# Patient Record
Sex: Male | Born: 1960
Health system: Southern US, Community
[De-identification: ages and names within clinical notes are randomized; demographics above are authoritative.]

## PROBLEM LIST (undated history)

## (undated) DIAGNOSIS — E7800 Pure hypercholesterolemia, unspecified: Secondary | ICD-10-CM

## (undated) DIAGNOSIS — N200 Calculus of kidney: Secondary | ICD-10-CM

## (undated) DIAGNOSIS — I10 Essential (primary) hypertension: Secondary | ICD-10-CM

## (undated) HISTORY — PX: LITHOTRIPSY: SUR834

---

## 2016-12-18 ENCOUNTER — Ambulatory Visit
Admission: RE | Admit: 2016-12-18 | Discharge: 2016-12-18 | Disposition: A | Discharge status: Home / Self Care | Payer: BLUE CROSS/BLUE SHIELD | Source: Ambulatory Visit | Attending: Cardiology | Admitting: Cardiology

## 2016-12-18 ENCOUNTER — Other Ambulatory Visit: Payer: Self-pay | Admitting: Cardiology

## 2016-12-18 DIAGNOSIS — R0602 Shortness of breath: Secondary | ICD-10-CM

## 2017-01-05 ENCOUNTER — Other Ambulatory Visit: Payer: Self-pay | Admitting: Cardiology

## 2017-01-05 DIAGNOSIS — I119 Hypertensive heart disease without heart failure: Secondary | ICD-10-CM

## 2017-01-08 ENCOUNTER — Ambulatory Visit
Admission: RE | Admit: 2017-01-08 | Discharge: 2017-01-08 | Disposition: A | Discharge status: Home / Self Care | Payer: No Typology Code available for payment source | Source: Ambulatory Visit | Attending: Cardiology | Admitting: Cardiology

## 2017-01-08 DIAGNOSIS — I119 Hypertensive heart disease without heart failure: Secondary | ICD-10-CM

## 2017-05-07 ENCOUNTER — Encounter (HOSPITAL_BASED_OUTPATIENT_CLINIC_OR_DEPARTMENT_OTHER): Payer: Self-pay

## 2017-05-07 ENCOUNTER — Emergency Department (HOSPITAL_BASED_OUTPATIENT_CLINIC_OR_DEPARTMENT_OTHER)
Admission: EM | Admit: 2017-05-07 | Discharge: 2017-05-07 | Disposition: A | Discharge status: Home / Self Care | Payer: BLUE CROSS/BLUE SHIELD | Attending: Emergency Medicine | Admitting: Emergency Medicine

## 2017-05-07 ENCOUNTER — Emergency Department (HOSPITAL_BASED_OUTPATIENT_CLINIC_OR_DEPARTMENT_OTHER): Payer: BLUE CROSS/BLUE SHIELD

## 2017-05-07 DIAGNOSIS — N189 Chronic kidney disease, unspecified: Secondary | ICD-10-CM | POA: Diagnosis not present

## 2017-05-07 DIAGNOSIS — N289 Disorder of kidney and ureter, unspecified: Secondary | ICD-10-CM

## 2017-05-07 DIAGNOSIS — Z87442 Personal history of urinary calculi: Secondary | ICD-10-CM | POA: Diagnosis not present

## 2017-05-07 DIAGNOSIS — I129 Hypertensive chronic kidney disease with stage 1 through stage 4 chronic kidney disease, or unspecified chronic kidney disease: Secondary | ICD-10-CM | POA: Diagnosis not present

## 2017-05-07 DIAGNOSIS — Z79899 Other long term (current) drug therapy: Secondary | ICD-10-CM | POA: Insufficient documentation

## 2017-05-07 DIAGNOSIS — R109 Unspecified abdominal pain: Secondary | ICD-10-CM | POA: Diagnosis present

## 2017-05-07 DIAGNOSIS — N201 Calculus of ureter: Secondary | ICD-10-CM | POA: Diagnosis not present

## 2017-05-07 HISTORY — DX: Essential (primary) hypertension: I10

## 2017-05-07 HISTORY — DX: Calculus of kidney: N20.0

## 2017-05-07 HISTORY — DX: Pure hypercholesterolemia, unspecified: E78.00

## 2017-05-07 LAB — BASIC METABOLIC PANEL
Anion gap: 10 (ref 5–15)
BUN: 15 mg/dL (ref 6–20)
CALCIUM: 9 mg/dL (ref 8.9–10.3)
CO2: 23 mmol/L (ref 22–32)
Chloride: 103 mmol/L (ref 101–111)
Creatinine, Ser: 1.31 mg/dL — ABNORMAL HIGH (ref 0.61–1.24)
GFR calc Af Amer: >60 mL/min (ref >60)
GLUCOSE: 136 mg/dL — AB (ref 65–99)
POTASSIUM: 4 mmol/L (ref 3.5–5.1)
Sodium: 136 mmol/L (ref 135–145)

## 2017-05-07 LAB — CBC
HCT: 44.7 % (ref 39.0–52.0)
Hemoglobin: 15.5 g/dL (ref 13.0–17.0)
MCH: 30.5 pg (ref 26.0–34.0)
MCHC: 34.7 g/dL (ref 30.0–36.0)
MCV: 88 fL (ref 78.0–100.0)
PLATELETS: 249 10*3/uL (ref 150–400)
RBC: 5.08 MIL/uL (ref 4.22–5.81)
RDW: 13.4 % (ref 11.5–15.5)
WBC: 9.2 10*3/uL (ref 4.0–10.5)

## 2017-05-07 LAB — URINALYSIS, ROUTINE W REFLEX MICROSCOPIC
BILIRUBIN URINE: NEGATIVE
GLUCOSE, UA: NEGATIVE mg/dL
Ketones, ur: NEGATIVE mg/dL
Leukocytes, UA: NEGATIVE
Nitrite: NEGATIVE
PH: 6 (ref 5.0–8.0)
Protein, ur: NEGATIVE mg/dL
SPECIFIC GRAVITY, URINE: 1.021 (ref 1.005–1.030)

## 2017-05-07 LAB — URINALYSIS, MICROSCOPIC (REFLEX)

## 2017-05-07 MED ORDER — ONDANSETRON 4 MG PO TBDP: 4.0000 mg | ORAL_TABLET | Freq: Three times a day (TID) | ORAL | 0 refills | Status: DC | PRN

## 2017-05-07 MED ORDER — OXYCODONE-ACETAMINOPHEN 5-325 MG PO TABS: 1.0000 | ORAL_TABLET | Freq: Four times a day (QID) | ORAL | 0 refills | Status: DC | PRN

## 2017-05-07 MED ORDER — TAMSULOSIN HCL 0.4 MG PO CAPS: 0.4000 mg | ORAL_CAPSULE | Freq: Every day | ORAL | 0 refills | Status: DC

## 2017-05-07 MED ORDER — HYDROMORPHONE HCL 1 MG/ML IJ SOLN
1.0000 mg | Freq: Once | INTRAMUSCULAR | Status: AC
  Administered 2017-05-07: 1 mg via INTRAVENOUS
  Filled 2017-05-07: qty 1

## 2017-05-07 MED ORDER — ONDANSETRON HCL 4 MG/2ML IJ SOLN
4.0000 mg | Freq: Once | INTRAMUSCULAR | Status: AC
  Administered 2017-05-07: 4 mg via INTRAVENOUS
  Filled 2017-05-07: qty 2

## 2017-05-07 MED FILL — OXYCODONE-ACETAMINOPHEN 5-3: 5-325 | 2 days supply | Qty: 15 | Fill #0

## 2017-05-07 MED FILL — TAMSULOSIN HCL 0.4 MG CAP: 0.4 | 30 days supply | Qty: 30 | Fill #0

## 2017-05-07 MED FILL — ONDANSETRON ODT 4 MG TABLET: 4 | 6 days supply | Qty: 20 | Fill #0

## 2017-05-07 NOTE — Discharge Instructions (Signed)
Return to the ED with any concerns including fever/chills, vomiting and not able to keep down liquids, pain that is not controlled by medications, decreased level of alertness/lethargy, or any other alarming symptoms  Your creatinine was mildly elevated- a measure of kidney function- you should be sure to increase your fluid intake and have the creatinine rechecked in one week

## 2017-05-07 NOTE — ED Triage Notes (Signed)
C/o right flank pain started last night-states feels like kidney stone-NAD-steady gait

## 2017-05-07 NOTE — ED Notes (Signed)
Took ibuprofen and Tramadol at 0930; Zofran 4mg  at 1130.

## 2017-05-07 NOTE — ED Notes (Signed)
Patient transported to CT 

## 2017-05-07 NOTE — ED Provider Notes (Signed)
[facility]EM[facility]ENCY DEPT Provider Note   CSN: [id number] Arrival date & time: 05/07/17  1144     History   Chief Complaint Chief Complaint  Patient presents with  . Flank Pain    HPI Melvin Escobar is a 56 y.o. male.  HPI  Pt presenting with c/o right sided flank pain.  Pain began approx midnight last night.  He took a tramadol and was able to sleep.  However today when trying to go to work the pain has been getting worse to the point of being unbearable.  Pain comes and goes.  Associated with some nausea, no vomiting.  No fever/chills.  Denies dysuria.  Has had hx of kidney stone- but last was more than 20 years ago.  There are no other associated systemic symptoms, there are no other alleviating or modifying factors.   Past Medical History:  Diagnosis Date  . High cholesterol   . Hypertension   . Kidney stone     There are no active problems to display for this patient.   Past Surgical History:  Procedure Laterality Date  . LITHOTRIPSY         Home Medications    Prior to Admission medications   Medication Sig Start Date End Date Taking? Authorizing Provider  Ezetimibe-Simvastatin (VYTORIN PO) Take by mouth.   Yes [provider]  FENOFIBRATE PO Take by mouth.   Yes [provider]  Ondansetron HCl (ZOFRAN PO) Take by mouth.   Yes [provider]  RAMIPRIL PO Take by mouth.   Yes [provider]  TRAMADOL HCL PO Take by mouth.   Yes [provider]  ondansetron (ZOFRAN ODT) 4 MG disintegrating tablet Take 1 tablet (4 mg total) by mouth every 8 (eight) hours as needed for nausea or vomiting. 05/07/17   Mabe, Latanya Maudlin, MD  oxyCODONE-acetaminophen (PERCOCET/ROXICET) 5-325 MG tablet Take 1-2 tablets by mouth every 6 (six) hours as needed for severe pain. 05/07/17   Mabe, Latanya Maudlin, MD  tamsulosin (FLOMAX) 0.4 MG CAPS capsule Take 1 capsule (0.4 mg total) by mouth daily. 05/07/17   Mabe, Latanya Maudlin, MD    Family History No family  history on file.  Social History Social History  Substance Use Topics  . Smoking status: Never Smoker  . Smokeless tobacco: Never Used  . Alcohol use No     Allergies   Patient has no known allergies.   Review of Systems Review of Systems  ROS reviewed and all otherwise negative except for mentioned in HPI   Physical Exam Updated Vital Signs BP 132/86 (BP Location: Right Arm)   Pulse 74   Temp 97.7 F (36.5 C) (Oral)   Resp 18   Ht 5\' 8"  (1.727 m)   Wt 105.2 kg (232 lb)   SpO2 95%   BMI 35.28 kg/m  Vitals reviewed Physical Exam Physical Examination: General appearance - alert, well appearing, and in no distress Mental status - alert, oriented to person, place, and time Eyes - no conjunctival injection, no scleral icterus Mouth - mucous membranes moist, pharynx normal without lesions Chest - clear to auscultation, no wheezes, rales or rhonchi, symmetric air entry Heart - normal rate, regular rhythm, normal S1, S2, no murmurs, rubs, clicks or gallops Abdomen - soft, nontender, nondistended, no masses or organomegaly Back exam - no midline tenderness to palpation, mild right CVA tenderness Neurological - alert, oriented, normal speech Extremities - peripheral pulses normal, no pedal edema, no clubbing or cyanosis Skin -  normal coloration and turgor, no rashes  ED Treatments / Results  Labs (all labs ordered are listed, but only abnormal results are displayed) Labs Reviewed  URINALYSIS, ROUTINE W REFLEX MICROSCOPIC - Abnormal; Notable for the following:       Result Value   Hgb urine dipstick MODERATE (*)    All other components within normal limits  BASIC METABOLIC PANEL - Abnormal; Notable for the following:    Glucose, Bld 136 (*)    Creatinine, Ser 1.31 (*)    All other components within normal limits  URINALYSIS, MICROSCOPIC (REFLEX) - Abnormal; Notable for the following:    Bacteria, UA MANY (*)    Squamous Epithelial / LPF 0-5 (*)    All other  components within normal limits  CBC    EKG  EKG Interpretation None       Radiology Ct Renal Stone Study  Result Date: 05/07/2017 CLINICAL DATA:  Right flank pain. EXAM: CT ABDOMEN AND PELVIS WITHOUT CONTRAST TECHNIQUE: Multidetector CT imaging of the abdomen and pelvis was performed following the standard protocol without IV contrast. COMPARISON:  None. FINDINGS: Lower chest: Unremarkable. Hepatobiliary: Mild diffuse low density of the liver relative to the spleen. 3 mm gallstone in the gallbladder. No gallbladder wall thickening or pericholecystic fluid. Pancreas: Unremarkable. No pancreatic ductal dilatation or surrounding inflammatory changes. Spleen: Normal in size without focal abnormality. Adrenals/Urinary Tract: Normal appearing adrenal glands, left kidney, left ureter and urinary bladder. Moderate dilatation of the right renal collecting system to the level of a 2 mm calculus at the ureteropelvic junction. No ureteral calculi more distally. Stomach/Bowel: Stomach is within normal limits. Appendix appears normal. No evidence of bowel wall thickening, distention, or inflammatory changes. Vascular/Lymphatic: Minimal atheromatous arterial calcification without aneurysm. No enlarged lymph nodes. Reproductive: Mildly enlarged prostate gland containing coarse calcifications. Other: Bilateral inguinal hernias containing fat. Tiny umbilical hernia containing fat. Musculoskeletal: L3 and L4 vertebral body hemangiomas. Similar-appearing area in the left iliac bone, possibly representing an hemangioma. Mild lumbar and lower thoracic spine degenerative changes. IMPRESSION: 1. 2 mm right UPJ calculus causing moderate right hydronephrosis. 2. Cholelithiasis without evidence cholecystitis. 3. Mild diffuse hepatic steatosis. Electronically Signed   By: Beckie SaltsSteven  Reid M.D.   On: 05/07/2017 12:59    Procedures Procedures (including critical care time)  Medications Ordered in ED Medications  HYDROmorphone  (DILAUDID) injection 1 mg (1 mg Intravenous Given 05/07/17 1240)  ondansetron (ZOFRAN) injection 4 mg (4 mg Intravenous Given 05/07/17 1240)     Initial Impression / Assessment and Plan / ED Course  I have reviewed the triage vital signs and the nursing notes.  Pertinent labs & imaging results that were available during my care of the patient were reviewed by me and considered in my medical decision making (see chart for details).     Pt presenting with c/o right sided flank pain.  Has had renal stone but not in the past 20 years.  Abdominal exam is benign.  CT scan shows 2mm obstructive stone.  Urine is reassuring.  Creat is 1.3- no baseline to compare.  Pt is drinking liquids well- started on flomax, percocet, zofran for symptom control.  Advised to call for f/u with urology.  Advised to have creatinine rechecked within 1 week. Pain controlled at time of discharge.   Discharged with strict return precautions.  Pt agreeable with plan.  Final Clinical Impressions(s) / ED Diagnoses   Final diagnoses:  Right ureteral stone  Renal insufficiency    New Prescriptions Discharge  Medication List as of 05/07/2017  2:10 PM    START taking these medications   Details  ondansetron (ZOFRAN ODT) 4 MG disintegrating tablet Take 1 tablet (4 mg total) by mouth every 8 (eight) hours as needed for nausea or vomiting., Starting Fri 05/07/2017, Print    oxyCODONE-acetaminophen (PERCOCET/ROXICET) 5-325 MG tablet Take 1-2 tablets by mouth every 6 (six) hours as needed for severe pain., Starting Fri 05/07/2017, Print    tamsulosin (FLOMAX) 0.4 MG CAPS capsule Take 1 capsule (0.4 mg total) by mouth daily., Starting Fri 05/07/2017, Print         Mabe, Latanya MaudlinMartha L, MD 05/08/17 [phone]

## 2017-06-21 ENCOUNTER — Other Ambulatory Visit: Payer: Self-pay | Admitting: Family Medicine

## 2017-06-21 DIAGNOSIS — R7989 Other specified abnormal findings of blood chemistry: Secondary | ICD-10-CM

## 2017-06-21 DIAGNOSIS — R945 Abnormal results of liver function studies: Principal | ICD-10-CM

## 2017-06-24 ENCOUNTER — Ambulatory Visit
Admission: RE | Admit: 2017-06-24 | Discharge: 2017-06-24 | Disposition: A | Discharge status: Home / Self Care | Payer: BLUE CROSS/BLUE SHIELD | Source: Ambulatory Visit | Attending: Family Medicine | Admitting: Family Medicine

## 2017-06-24 DIAGNOSIS — R7989 Other specified abnormal findings of blood chemistry: Secondary | ICD-10-CM

## 2017-06-24 DIAGNOSIS — R945 Abnormal results of liver function studies: Principal | ICD-10-CM

## 2017-09-29 IMAGING — CT CT HEART SCORING
1 of 2 series · 11 of 20 positions shown, 14 images · non-contrast
Comparison: Chest radiograph 12/18/2016

CLINICAL DATA: Intermittent chest pain. Family history of heart
disease. Hypertensive cardiomegaly.

EXAM:
CT HEART FOR CALCIUM SCORING
TECHNIQUE: CT heart was performed on a 64 channel system using prospective ECG
gating.
A non-contrast exam for calcium scoring was performed.
Note that this exam targets the heart and the chest was not imaged
in its entirety.

[Series 2: smartscore - gated 0.4 sec · axial · 0.50mm/px · z∈[-277,-147]mm · 11 of 64 slices shown, 14 images]
[im 6/64  vessel]
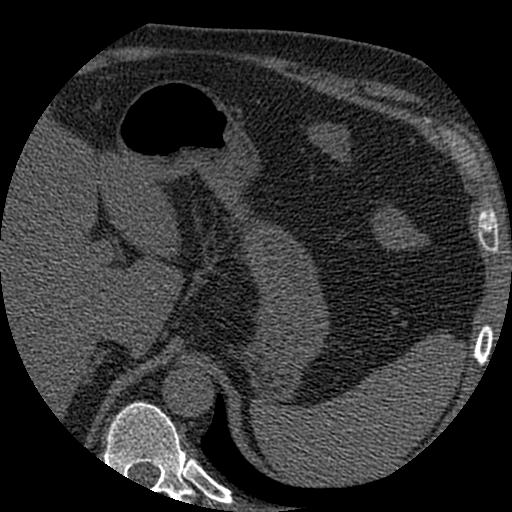
[im 6/64  lung]
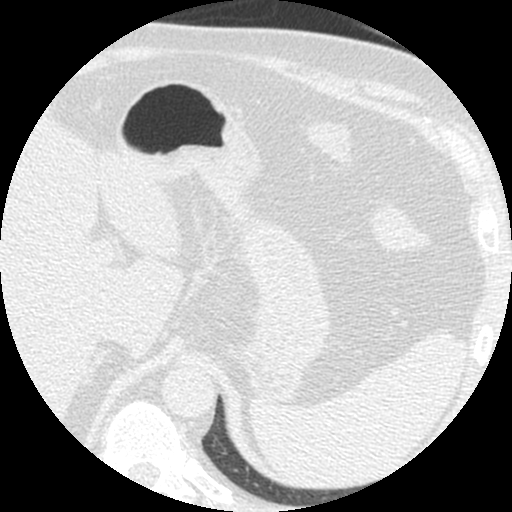
[im 11/64  vessel]
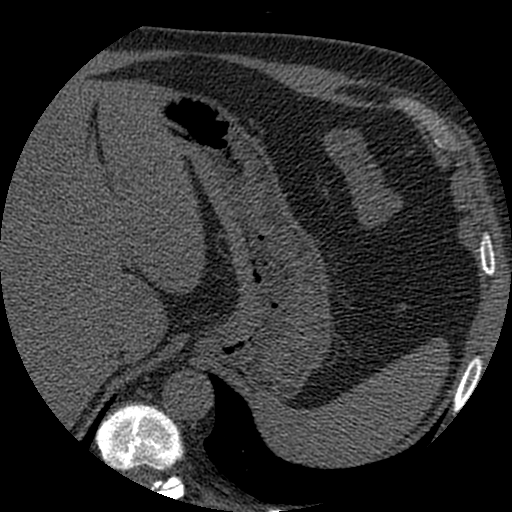
[im 16/64  vessel]
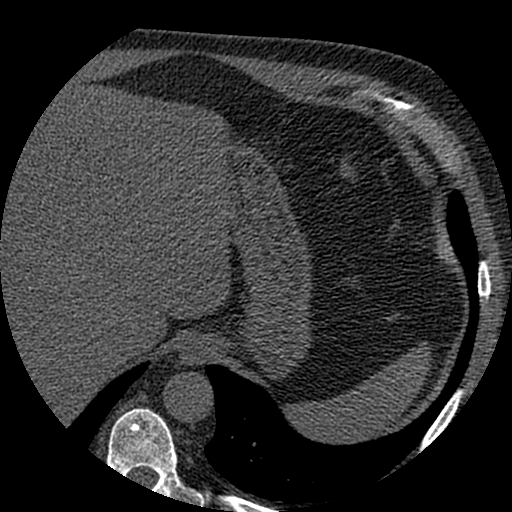
[im 22/64  vessel]
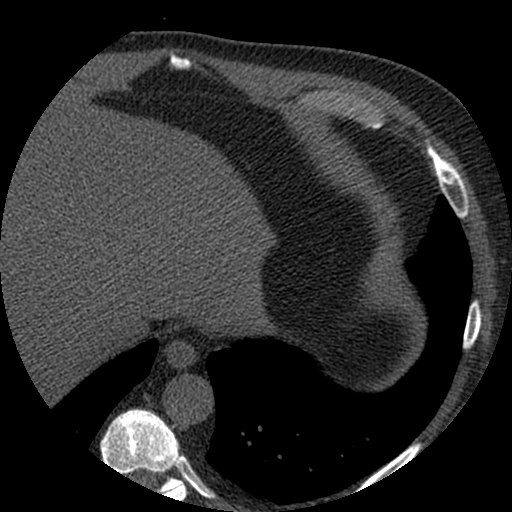
[im 27/64  vessel]
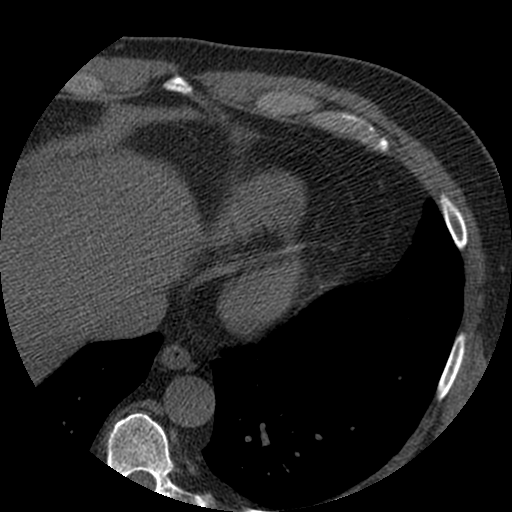
[im 27/64  lung]
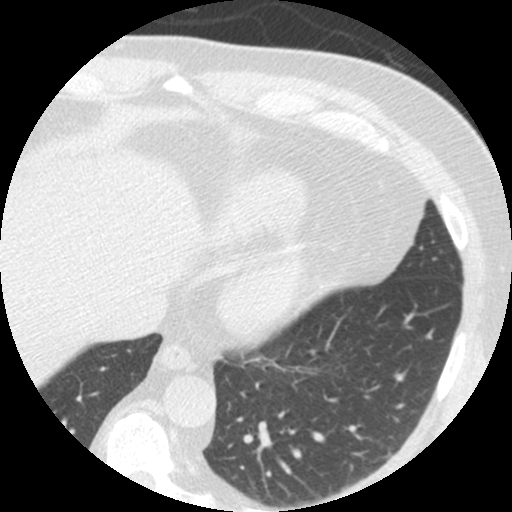
[im 32/64  vessel]
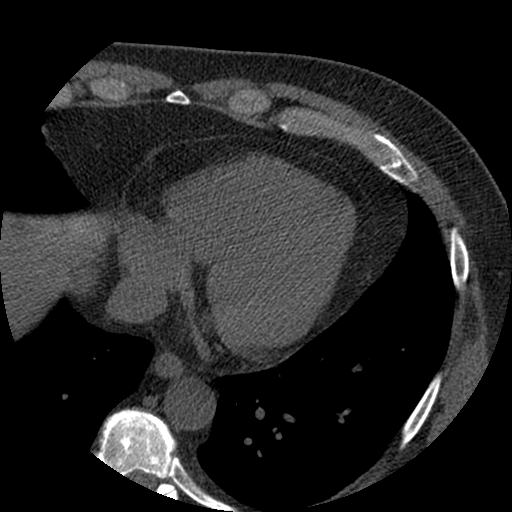
[im 37/64  vessel]
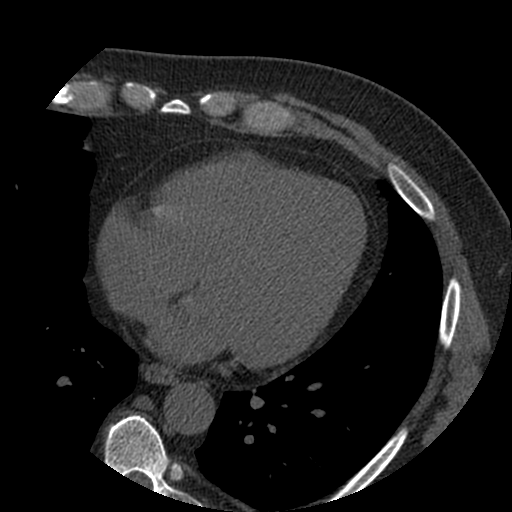
[im 43/64  vessel]
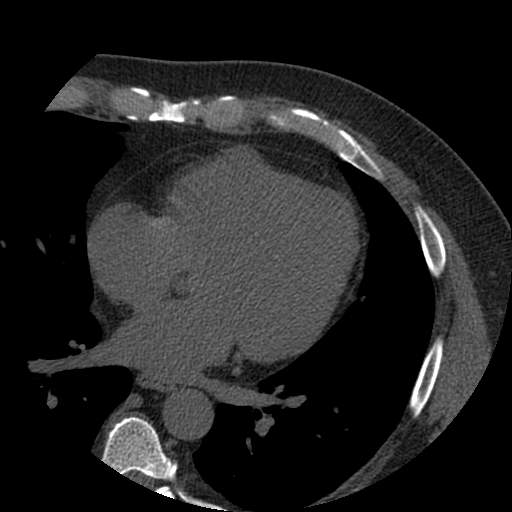
[im 48/64  vessel]
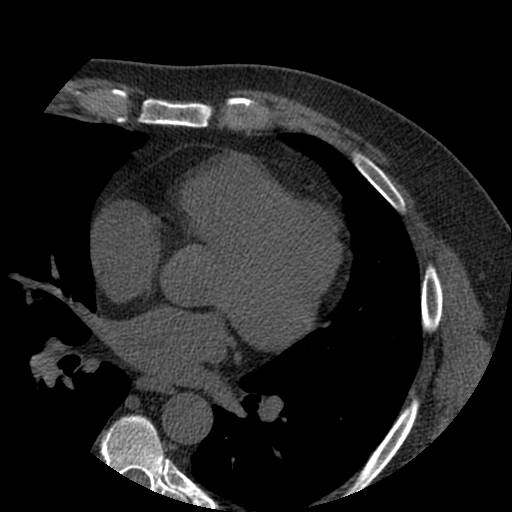
[im 48/64  lung]
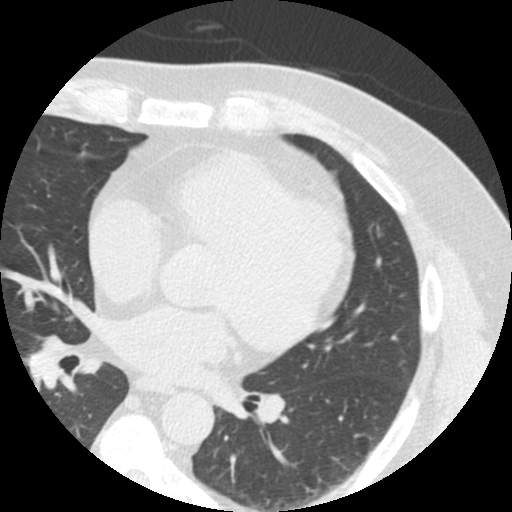
[im 53/64  vessel]
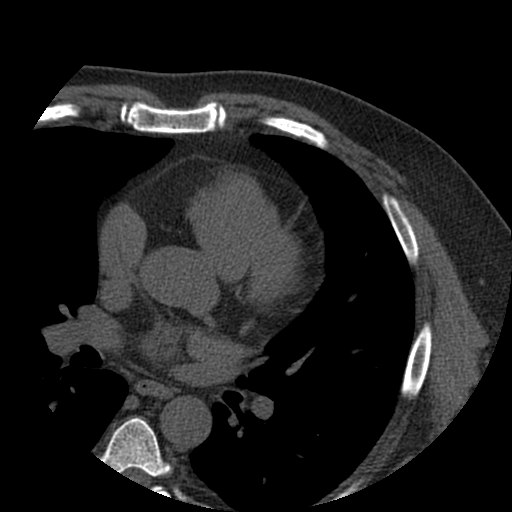
[im 58/64  vessel]
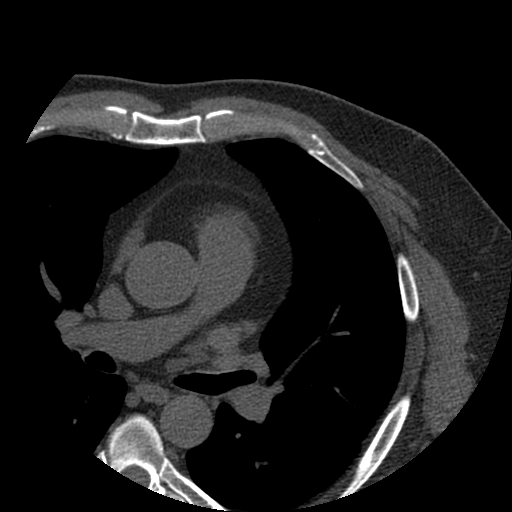

[11 of 20 positions shown; findings below may reference images not displayed]

FINDINGS: Technical quality: Adequate

CORONARY CALCIUM

Total Agatston Score:

[HOSPITAL] percentile:  52

OTHER FINDINGS:

Cardiovascular: Heart size is normal. No significant pericardial
fluid. Normal caliber of the visualized thoracic aorta. Trace
calcifications near the aortic root. Normal caliber of the
visualized pulmonary arteries. Small focus of calcification
involving the distal left circumflex artery near the PDA. No other
definite coronary artery calcifications.

Mediastinum/Nodes: Visualized esophagus is unremarkable. No
significant lymph node enlargement.

Lungs/Pleura: No pleural fluid.  Visualized lungs are clear.

Upper Abdomen: Visualized upper abdominal structures are
unremarkable.

Musculoskeletal: No acute bone abnormality.
IMPRESSION: Coronary calcium score is 5.63 and that is 52 percentile for
patients of the same age, gender and ethnicity.

## 2021-05-28 DIAGNOSIS — E7800 Pure hypercholesterolemia, unspecified: Secondary | ICD-10-CM | POA: Insufficient documentation

## 2021-05-28 DIAGNOSIS — N200 Calculus of kidney: Secondary | ICD-10-CM | POA: Insufficient documentation

## 2021-05-28 DIAGNOSIS — I10 Essential (primary) hypertension: Secondary | ICD-10-CM | POA: Insufficient documentation

## 2021-06-04 ENCOUNTER — Encounter: Payer: Self-pay | Admitting: Cardiology

## 2021-06-04 ENCOUNTER — Other Ambulatory Visit: Payer: Self-pay

## 2021-06-04 ENCOUNTER — Ambulatory Visit (INDEPENDENT_AMBULATORY_CARE_PROVIDER_SITE_OTHER): Payer: 59 | Admitting: Cardiology

## 2021-06-04 VITALS — BP 124/70 | HR 85 | Ht 68.0 in | Wt 235.1 lb

## 2021-06-04 DIAGNOSIS — R0602 Shortness of breath: Secondary | ICD-10-CM

## 2021-06-04 DIAGNOSIS — E782 Mixed hyperlipidemia: Secondary | ICD-10-CM | POA: Diagnosis not present

## 2021-06-04 DIAGNOSIS — R931 Abnormal findings on diagnostic imaging of heart and coronary circulation: Secondary | ICD-10-CM

## 2021-06-04 DIAGNOSIS — I10 Essential (primary) hypertension: Secondary | ICD-10-CM | POA: Diagnosis not present

## 2021-06-04 MED ORDER — METOPROLOL TARTRATE 100 MG PO TABS: 100.0000 mg | ORAL_TABLET | Freq: Once | ORAL | 0 refills | Status: DC

## 2021-06-04 NOTE — Progress Notes (Signed)
Cardiology Office Note:    Date:  06/04/2021   ID:  SAFWAN TOMEI, DOB 04/03/1961, MRN [id number]  PCP:  Tally Joe, MD  Cardiologist:  Norman Herrlich, MD   Referring MD: Tally Joe, MD  ASSESSMENT:    1. SOB (shortness of breath) on exertion   2. Primary hypertension   3. Agatston coronary artery calcium score less than 100    PLAN:    In order of problems listed above:  His clinical problem shortness of breath exertional which is beginning to interfere with his life with associated exertional thoracic pain but is since uncommon that he has scapular discomfort.  His traditional risk factors and noteworthy for hypertension dyslipidemia but he also has a family history of premature CAD in her mid range coronary artery calcium score.  For further evaluation has to do a proBNP level if normal I do not think he requires an echocardiogram as it typically separates pulmonary from cardiac sources of shortness of breath and a cardiac CTA to answer if he has obstructive CAD.  He has no dye allergy I reviewed this with the patient he agrees and I do not think functional testing or stress testing would be helpful.  If the studies are unremarkable then formal PFTs and will also get chest CT as part of the CTA protocol Stable continue current treatment ACE inhibitor Lipids are at target continue combined therapy statin Zetia fenofibric  Next appointment 6 weeks   Medication Adjustments/Labs and Tests Ordered: Current medicines are reviewed at length with the patient today.  Concerns regarding medicines are outlined above.  Orders Placed This Encounter  Procedures   CT CORONARY MORPH W/CTA COR W/SCORE W/CA W/CM &/OR WO/CM   Pro b natriuretic peptide (BNP)   Basic metabolic panel   EKG 12-Lead   No orders of the defined types were placed in this encounter.     Chief complaint: Shortness of breath and aching in my left scapular area with activity that is becoming bothersome.  I am  concerned to have underlying heart disease  History of Present Illness:    JACERE PANGBORN is a 60 y.o. male who is being seen today for the evaluation of shortness of breath at the request of Tally Joe, MD.   chart review shows that he has a history of sleep apnea prediabetes hypertension hyperlipidemia obesity hepatic steatosis   recent labs showed A1c 5.8% hemoglobin 16.4 creatinine 1.01 GFR 86 cc potassium 4.7 sodium 141 Cholesterol 185 triglycerides 195 HDL 40 LDL 110  Office note 04/15/2021 with a history of chest discomfort and shortness of breath as well as a normal treadmill stress test when he had seen Dr. Donnie Aho in 2018  He had a coronary CT calcium score performed 2018 with a calcium score 5.6 -52nd percentile for age sex decision with focal calcification of distal left circumflex coronary artery and mild aortic atherosclerosis.  He has developed shortness of breath and is concerned he has underlying heart disease He is incorporating recorder he walks with parking lot and climbs stairs he is breathless he does not need to stop and rest but he gets an achy left scapular area associated with it. He has CPAP and is compliant with his device He notices edema in his ankles the end of the day when he first gets into bed to just CPAP he finds himself breathless but he does not have PND. He has no known history of congenital rheumatic heart disease He is a non-smoker  and has no history of occupational lung disease no cough or wheezing. His cardiovascular risk factors are also noteworthy for father who had myocardial infarction at age less than 24 and multiple strokes and his mother passed late in life.  Past Medical History:  Diagnosis Date   High cholesterol    Hypertension    Kidney stone     Past Surgical History:  Procedure Laterality Date   LITHOTRIPSY      Current Medications: Current Meds  Medication Sig   ezetimibe-simvastatin (VYTORIN) 10-20 MG tablet Take 1  tablet by mouth daily.   FENOFIBRATE PO Take by mouth.   Omega-3 Fatty Acids (FISH OIL) 1000 MG CAPS Take 1 capsule by mouth daily.   RAMIPRIL PO Take 10 mg by mouth daily.   Semaglutide (OZEMPIC, 1 MG/DOSE,  AFB) Inject 1 mg into the skin once a week.     Allergies:   Patient has no known allergies.   Social History   Socioeconomic History   Marital status: Married    Spouse name: Not on file   Number of children: Not on file   Years of education: Not on file   Highest education level: Not on file  Occupational History   Not on file  Tobacco Use   Smoking status: Never   Smokeless tobacco: Never  Substance and Sexual Activity   Alcohol use: No   Drug use: No   Sexual activity: Not on file  Other Topics Concern   Not on file  Social History Narrative   Not on file   Social Determinants of Health   Financial Resource Strain: Not on file  Food Insecurity: Not on file  Transportation Needs: Not on file  Physical Activity: Not on file  Stress: Not on file  Social Connections: Not on file     Family History: The patient's significant for CAD in both parents is detailed on her history  ROS:   ROS Please see the history of present illness.     All other systems reviewed and are negative.  EKGs/Labs/Other Studies Reviewed:    The following studies were reviewed today:   EKG:  EKG is  ordered today.  The ekg ordered today is personally reviewed and demonstrates sinus rhythm and is normal    Physical Exam:    VS:  BP 124/70 (BP Location: Right Arm, Patient Position: Sitting, Cuff Size: Large)   Pulse 85   Ht 5\' 8"  (1.727 m)   Wt 235 lb 1.3 oz (106.6 kg)   SpO2 97%   BMI 35.74 kg/m     Wt Readings from Last 3 Encounters:  06/04/21 235 lb 1.3 oz (106.6 kg)  05/07/17 232 lb (105.2 kg)     GEN:  Well nourished, well developed in no acute distress HEENT: Normal NECK: No JVD; No carotid bruits LYMPHATICS: No lymphadenopathy CARDIAC: RRR, no murmurs, rubs,  gallops RESPIRATORY:  Clear to auscultation without rales, wheezing or rhonchi  ABDOMEN: Soft, non-tender, non-distended MUSCULOSKELETAL:  No edema; No deformity  SKIN: Warm and dry NEUROLOGIC:  Alert and oriented x 3 PSYCHIATRIC:  Normal affect     Signed, 07/07/17, MD  06/04/2021 11:15 AM    [facility]

## 2021-06-04 NOTE — Patient Instructions (Addendum)
Medication Instructions:  Your physician recommends that you continue on your current medications as directed. Please refer to the Current Medication list given to you today.  *If you need a refill on your cardiac medications before your next appointment, please call your pharmacy*   Lab Work: Your physician recommends that you return for lab work in: TODAY BMP, ProBNP If you have labs (blood work) drawn today and your tests are completely normal, you will receive your results only by: MyChart Message (if you have MyChart) OR A paper copy in the mail If you have any lab test that is abnormal or we need to change your treatment, we will call you to review the results.   Testing/Procedures:   Your cardiac CT will be scheduled at the below location:   [facility] [address] [place], [place] [address] [phone]  If scheduled at [facility], please arrive at the [facility] main entrance (entrance A) of [facility] 30 minutes prior to test start time. Proceed to the [facility] Radiology Department (first floor) to check-in and test prep.  Please follow these instructions carefully (unless otherwise directed):  On the Night Before the Test: Be sure to Drink plenty of water. Do not consume any caffeinated/decaffeinated beverages or chocolate 12 hours prior to your test. Do not take any antihistamines 12 hours prior to your test.  On the Day of the Test: Drink plenty of water until 1 hour prior to the test. Do not eat any food 4 hours prior to the test. You may take your regular medications prior to the test.  Take metoprolol (Lopressor) two hours prior to test.  After the Test: Drink plenty of water. After receiving IV contrast, you may experience a mild flushed feeling. This is normal. On occasion, you may experience a mild rash up to 24 hours after the test. This is not dangerous. If this occurs, you can take Benadryl 25 mg and increase your  fluid intake. If you experience trouble breathing, this can be serious. If it is severe call 911 IMMEDIATELY. If it is mild, please call our office. If you take any of these medications: Glipizide/Metformin, Avandament, Glucavance, please do not take 48 hours after completing test unless otherwise instructed.  Please allow 2-4 weeks for scheduling of routine cardiac CTs. Some insurance companies require a pre-authorization which may delay scheduling of this test.   For non-scheduling related questions, please contact the cardiac imaging nurse navigator should you have any questions/concerns: Rockwell Alexandria, Cardiac Imaging Nurse Navigator Larey Brick, Cardiac Imaging Nurse Navigator [facility] Direct Office Dial: [phone]   For scheduling needs, including cancellations and rescheduling, please call [place], [phone].    Follow-Up: At [facility], you and your health needs are our priority.  As part of our continuing mission to provide you with exceptional heart care, we have created designated Provider Care Teams.  These Care Teams include your primary Cardiologist (physician) and Advanced Practice Providers (APPs -  Physician Assistants and Nurse Practitioners) who all work together to provide you with the care you need, when you need it.  We recommend signing up for the patient portal called "MyChart".  Sign up information is provided on this After Visit Summary.  MyChart is used to connect with patients for Virtual Visits (Telemedicine).  Patients are able to view lab/test results, encounter notes, upcoming appointments, etc.  Non-urgent messages can be sent to your provider as well.   To learn  more about what you can do with MyChart, go to [email or web address].    Your next appointment:   6 week(s)  The format for your next appointment:   In Person  Provider:   Shirlee More, MD   Other Instructions

## 2021-06-05 ENCOUNTER — Telehealth: Payer: Self-pay

## 2021-06-05 LAB — BASIC METABOLIC PANEL
BUN/Creatinine Ratio: 13 (ref 9–20)
BUN: 13 mg/dL (ref 6–24)
CO2: 23 mmol/L (ref 20–29)
Calcium: 9.3 mg/dL (ref 8.7–10.2)
Chloride: 102 mmol/L (ref 96–106)
Creatinine, Ser: 1.01 mg/dL (ref 0.76–1.27)
Glucose: 146 mg/dL — ABNORMAL HIGH (ref 65–99)
Potassium: 4.5 mmol/L (ref 3.5–5.2)
Sodium: 138 mmol/L (ref 134–144)
eGFR: 86 mL/min/{1.73_m2} (ref >59)

## 2021-06-05 LAB — PRO B NATRIURETIC PEPTIDE: NT-Pro BNP: 27 pg/mL (ref 0–210)

## 2021-06-05 NOTE — Telephone Encounter (Signed)
Pt returning call for results

## 2021-06-05 NOTE — Telephone Encounter (Signed)
Spoke with patient regarding results and recommendation.  Patient verbalizes understanding and is agreeable to plan of care. Advised patient to call back with any issues or concerns.  

## 2021-06-05 NOTE — Telephone Encounter (Signed)
-----   Message from Baldo Daub, MD sent at 06/05/2021  7:53 AM EDT ----- Good result, the lab test to screen for heart failure is normal makes it unlikely that the shortness of breath is due to mechanical problems in his heart.  I still think he should have a cardiac CTA performed.

## 2021-06-05 NOTE — Telephone Encounter (Signed)
Left message on patients voicemail to please return our call.   

## 2021-06-11 ENCOUNTER — Telehealth (HOSPITAL_COMMUNITY): Payer: Self-pay | Admitting: Emergency Medicine

## 2021-06-11 NOTE — Telephone Encounter (Signed)
Reaching out to patient to offer assistance regarding upcoming cardiac imaging study; pt verbalizes understanding of appt date/time, parking situation and where to check in, pre-test NPO status and medications ordered, and verified current allergies; name and call back number provided for further questions should they arise Caedyn Tassinari RN Navigator Cardiac Imaging [facility] [phone] office [phone] cell  100mg metoprolol tart  

## 2021-06-13 ENCOUNTER — Telehealth: Payer: Self-pay

## 2021-06-13 ENCOUNTER — Encounter (HOSPITAL_COMMUNITY): Payer: Self-pay

## 2021-06-13 ENCOUNTER — Ambulatory Visit (HOSPITAL_COMMUNITY)
Admission: RE | Admit: 2021-06-13 | Discharge: 2021-06-13 | Disposition: A | Discharge status: Home / Self Care | Payer: 59 | Source: Ambulatory Visit | Attending: Cardiology | Admitting: Cardiology

## 2021-06-13 ENCOUNTER — Other Ambulatory Visit: Payer: Self-pay

## 2021-06-13 DIAGNOSIS — R0602 Shortness of breath: Secondary | ICD-10-CM | POA: Diagnosis not present

## 2021-06-13 DIAGNOSIS — R931 Abnormal findings on diagnostic imaging of heart and coronary circulation: Secondary | ICD-10-CM | POA: Insufficient documentation

## 2021-06-13 DIAGNOSIS — Z006 Encounter for examination for normal comparison and control in clinical research program: Secondary | ICD-10-CM

## 2021-06-13 DIAGNOSIS — I2510 Atherosclerotic heart disease of native coronary artery without angina pectoris: Secondary | ICD-10-CM | POA: Diagnosis not present

## 2021-06-13 MED ORDER — NITROGLYCERIN 0.4 MG SL SUBL
SUBLINGUAL_TABLET | SUBLINGUAL | Status: AC
  Filled 2021-06-13: qty 2

## 2021-06-13 MED ORDER — IOHEXOL 350 MG/ML SOLN
95.0000 mL | Freq: Once | INTRAVENOUS | Status: AC | PRN
  Administered 2021-06-13: 95 mL via INTRAVENOUS

## 2021-06-13 MED ORDER — NITROGLYCERIN 0.4 MG SL SUBL
0.8000 mg | SUBLINGUAL_TABLET | Freq: Once | SUBLINGUAL | Status: AC
  Administered 2021-06-13: 0.8 mg via SUBLINGUAL

## 2021-06-13 NOTE — Research (Unsigned)
IDENTIFY Informed Consent                  Subject Name:Melvin Escobar   Subject met inclusion and exclusion criteria.  The informed consent form, study requirements and expectations were reviewed with the subject and questions and concerns were addressed prior to the signing of the consent form.  The subject verbalized understanding of the trial requirements.  The subject agreed to participate in the IDENTIFY trial and signed the informed consent at 0755 on 06/13/21.  The informed consent was obtained prior to performance of any protocol-specific procedures for the subject.  A copy of the signed informed consent was given to the subject and a copy was placed in the subject's medical record.    Chanda Busing, Research Coordinator

## 2021-06-13 NOTE — Telephone Encounter (Signed)
Spoke with patient regarding results and recommendation.  Patient verbalizes understanding and is agreeable to plan of care. Advised patient to call back with any issues or concerns.  

## 2021-06-13 NOTE — Telephone Encounter (Signed)
-----   Message from Baldo Daub, MD sent at 06/13/2021 12:33 PM EDT ----- His coronary artery calcium score is moderately elevated his current lipid lowering treatment is appropriate continue the same  He has mild atherosclerosis and stenosis in the coronary vessels not affecting blood flow.  I do not think this is responsible for her shortness of breath  We can discuss further at office follow-up.

## 2021-07-17 ENCOUNTER — Ambulatory Visit (INDEPENDENT_AMBULATORY_CARE_PROVIDER_SITE_OTHER): Payer: 59 | Admitting: Cardiology

## 2021-07-17 ENCOUNTER — Other Ambulatory Visit: Payer: Self-pay

## 2021-07-17 ENCOUNTER — Encounter: Payer: Self-pay | Admitting: Cardiology

## 2021-07-17 VITALS — BP 116/78 | HR 68 | Ht 68.0 in | Wt 230.0 lb

## 2021-07-17 DIAGNOSIS — I10 Essential (primary) hypertension: Secondary | ICD-10-CM

## 2021-07-17 DIAGNOSIS — E782 Mixed hyperlipidemia: Secondary | ICD-10-CM

## 2021-07-17 DIAGNOSIS — R931 Abnormal findings on diagnostic imaging of heart and coronary circulation: Secondary | ICD-10-CM | POA: Diagnosis not present

## 2021-07-17 DIAGNOSIS — I2510 Atherosclerotic heart disease of native coronary artery without angina pectoris: Secondary | ICD-10-CM

## 2021-07-17 MED ORDER — EZETIMIBE 10 MG PO TABS: 10.0000 mg | ORAL_TABLET | Freq: Every day | ORAL | 3 refills | Status: AC

## 2021-07-17 MED ORDER — ROSUVASTATIN CALCIUM 20 MG PO TABS: 20.0000 mg | ORAL_TABLET | Freq: Every day | ORAL | 3 refills | Status: AC

## 2021-07-17 NOTE — Progress Notes (Signed)
Cardiology Office Note:    Date:  07/17/2021   ID:  Melvin Escobar, DOB [birth year], MRN [id number]  PCP:  Tally Joe, MD  Cardiologist:  Norman Herrlich, MD    Referring MD: Tally Joe, MD    ASSESSMENT:    1. Mild CAD   2. Agatston coronary artery calcium score less than 100   3. Primary hypertension   4. Mixed hyperlipidemia    PLAN:    In order of problems listed above:  He has embraced lifestyle change has good healthcare literacy aware of the results of his CTA and committed to a good outcome.  He will transition to high intensity statin plus Zetia on his next visit I asked him to request an LP(a) level.  I will plan to see back in the office in 1 year Goal LDL less than 55 if he continues to struggle to achieve a PCSK9 inhibitor plus rosuvastatin would be appropriate Stable continue current treatment ACE inhibitor Transition to high intensity statin if LDL remains greater 55 PCSK9 inhibitor plus statin   Next appointment: 1 year   Medication Adjustments/Labs and Tests Ordered: Current medicines are reviewed at length with the patient today.  Concerns regarding medicines are outlined above.  No orders of the defined types were placed in this encounter.  Meds ordered this encounter  Medications   rosuvastatin (CRESTOR) 20 MG tablet    Sig: Take 1 tablet (20 mg total) by mouth daily.    Dispense:  90 tablet    Refill:  3   ezetimibe (ZETIA) 10 MG tablet    Sig: Take 1 tablet (10 mg total) by mouth daily.    Dispense:  90 tablet    Refill:  3     Chief Complaint  Patient presents with   Follow-up   Coronary Artery Disease    History of Present Illness:    Melvin Escobar is a 60 y.o. male with a hx of hypertension hyperlipidemia nonalcoholic hepatic steatosis prediabetes sleep apnea  last seen 06/04/2021 for shortness of breath chest discomfort and referred for coronary CTA. Compliance with diet, lifestyle and medications: Yes  We had nice  opportunity to sit down go over the results of his cardiac CTA First he has mild hepatic steatosis the treatment for this is really change in lifestyle diet activity weight loss and he is doing all of these endeavors he is now on semaglutide and I think this is highly effective in some recent information shows that these agents and SGLT2 inhibitors may be the treatment of early hepatic steatosis Second his calcium score is not severely elevated but with CAD he needs to transition to high intensity statin rosuvastatin plus Zetia at next prescription Third he has mild CAD less than 50% most severe stenosis distal left circumflex He is highly motivated to take care of himself and is not having angina I do not think his shortness of breath is anginal in nature.  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  On over read he had mild hepatic steatosis Past Medical History:  Diagnosis Date   High cholesterol    Hypertension    Kidney stone     Past Surgical History:  Procedure Laterality Date   LITHOTRIPSY  Current Medications: Current Meds  Medication Sig   ezetimibe (ZETIA) 10 MG tablet Take 1 tablet (10 mg total) by mouth daily.   FENOFIBRATE PO Take by mouth.   Omega-3 Fatty Acids (FISH OIL) 1000 MG CAPS Take 1 capsule by mouth daily.   RAMIPRIL PO Take 10 mg by mouth daily.   rosuvastatin (CRESTOR) 20 MG tablet Take 1 tablet (20 mg total) by mouth daily.   Semaglutide (OZEMPIC, 1 MG/DOSE, [place]) Inject 1 mg into the skin once a week.   [DISCONTINUED] ezetimibe-simvastatin (VYTORIN) 10-20 MG tablet Take 1 tablet by mouth daily.     Allergies:   Patient has no known allergies.   Social History   Socioeconomic History   Marital status: Married    Spouse  name: Not on file   Number of children: Not on file   Years of education: Not on file   Highest education level: Not on file  Occupational History   Not on file  Tobacco Use   Smoking status: Never   Smokeless tobacco: Never  Substance and Sexual Activity   Alcohol use: No   Drug use: No   Sexual activity: Not on file  Other Topics Concern   Not on file  Social History Narrative   Not on file   Social Determinants of Health   Financial Resource Strain: Not on file  Food Insecurity: Not on file  Transportation Needs: Not on file  Physical Activity: Not on file  Stress: Not on file  Social Connections: Not on file     Family History: The patient's family history includes COPD in his mother; Cancer in his father; Heart attack in his father; Heart disease in his father and mother; Heart disease (age of onset: 19) in his brother; High Cholesterol in his brother; Stroke in his father. ROS:   Please see the history of present illness.    All other systems reviewed and are negative.  EKGs/Labs/Other Studies Reviewed:    The following studies were reviewed today:    Recent Labs: 06/04/2021: BUN 13; Creatinine, Ser 1.01; NT-Pro BNP 27; Potassium 4.5; Sodium 138  Recent Lipid Panel recent labs showed A1c 5.8% hemoglobin 16.4 creatinine 1.01 GFR 86 cc potassium 4.7 sodium 141 Cholesterol 185 triglycerides 195 HDL 40 LDL 110  Physical Exam:    VS:  BP 116/78 (BP Location: Left Arm, Patient Position: Sitting, Cuff Size: Normal)   Pulse 68   Ht 5\' 8"  (1.727 m)   Wt 230 lb (104.3 kg)   SpO2 97%   BMI 34.97 kg/m     Wt Readings from Last 3 Encounters:  07/17/21 230 lb (104.3 kg)  06/04/21 235 lb 1.3 oz (106.6 kg)  05/07/17 232 lb (105.2 kg)     GEN:  Well nourished, well developed in no acute distress HEENT: Normal NECK: No JVD; No carotid bruits LYMPHATICS: No lymphadenopathy CARDIAC: RRR, no murmurs, rubs, gallops RESPIRATORY:  Clear to auscultation without rales,  wheezing or rhonchi  ABDOMEN: Soft, non-tender, non-distended MUSCULOSKELETAL:  No edema; No deformity  SKIN: Warm and dry NEUROLOGIC:  Alert and oriented x 3 PSYCHIATRIC:  Normal affect    Signed, 07/07/17, MD  07/17/2021 8:49 AM    [facility]

## 2021-07-17 NOTE — Patient Instructions (Signed)
Medication Instructions:  Your physician has recommended you make the following change in your medication:  STOP: Vytorin  START: Rosuvastatin 20 mg daily.  START: Zetia 10 mg daily.  *If you need a refill on your cardiac medications before your next appointment, please call your pharmacy*   Lab Work: None If you have labs (blood work) drawn today and your tests are completely normal, you will receive your results only by: MyChart Message (if you have MyChart) OR A paper copy in the mail If you have any lab test that is abnormal or we need to change your treatment, we will call you to review the results.   Testing/Procedures: None   Follow-Up: At [facility], you and your health needs are our priority.  As part of our continuing mission to provide you with exceptional heart care, we have created designated Provider Care Teams.  These Care Teams include your primary Cardiologist (physician) and Advanced Practice Providers (APPs -  Physician Assistants and Nurse Practitioners) who all work together to provide you with the care you need, when you need it.  We recommend signing up for the patient portal called "MyChart".  Sign up information is provided on this After Visit Summary.  MyChart is used to connect with patients for Virtual Visits (Telemedicine).  Patients are able to view lab/test results, encounter notes, upcoming appointments, etc.  Non-urgent messages can be sent to your provider as well.   To learn more about what you can do with MyChart, go to [email or web address].    Your next appointment:   1 year(s)  The format for your next appointment:   In Person  Provider:   Norman Herrlich, MD   Other Instructions

## 2023-02-03 ENCOUNTER — Other Ambulatory Visit: Payer: Self-pay | Admitting: Family Medicine

## 2023-02-03 DIAGNOSIS — L729 Follicular cyst of the skin and subcutaneous tissue, unspecified: Secondary | ICD-10-CM

## 2023-02-22 ENCOUNTER — Ambulatory Visit
Admission: RE | Admit: 2023-02-22 | Discharge: 2023-02-22 | Disposition: A | Discharge status: Home / Self Care | Payer: 59 | Source: Ambulatory Visit | Attending: Family Medicine | Admitting: Family Medicine

## 2023-02-22 DIAGNOSIS — L729 Follicular cyst of the skin and subcutaneous tissue, unspecified: Secondary | ICD-10-CM

## 2023-04-01 ENCOUNTER — Ambulatory Visit
Admission: EM | Admit: 2023-04-01 | Discharge: 2023-04-01 | Disposition: A | Discharge status: Home / Self Care | Payer: 59 | Attending: Urgent Care | Admitting: Urgent Care

## 2023-04-01 DIAGNOSIS — H6121 Impacted cerumen, right ear: Secondary | ICD-10-CM | POA: Insufficient documentation

## 2023-04-01 DIAGNOSIS — J020 Streptococcal pharyngitis: Secondary | ICD-10-CM | POA: Diagnosis not present

## 2023-04-01 LAB — POCT RAPID STREP A (OFFICE): Rapid Strep A Screen: POSITIVE — AB

## 2023-04-01 MED ORDER — AMOXICILLIN 500 MG PO CAPS: 500.0000 mg | ORAL_CAPSULE | Freq: Two times a day (BID) | ORAL | 0 refills | Status: AC

## 2023-04-01 NOTE — ED Triage Notes (Signed)
Pt reports sore throat x 1 week. Pain is worse at night. Pt think may  be a canker sore. Pt has not tried any meds from complaints,

## 2023-04-01 NOTE — ED Provider Notes (Addendum)
[facility]  Note:  This document was prepared using Conservation officer, historic buildings and may include unintentional dictation errors.  MRN: [id number] DOB: 1961/03/30  Subjective:   Melvin Escobar is a 62 y.o. male presenting for 1 week history of acute onset persistent intermittent throat pain, painful swallowing, concerns about a canker sore. Would also like STI testing. No medications tried otc. No fever, malaise, fatigue, cough, sinus symptoms.   No current facility-administered medications for this encounter.  Current Outpatient Medications:    tirzepatide [facility]) 5 MG/0.5ML Pen, inject 5 mg Subcutaneous Once a week for 28 days, Disp: , Rfl:    ezetimibe (ZETIA) 10 MG tablet, Take 1 tablet (10 mg total) by mouth daily., Disp: 90 tablet, Rfl: 3   FENOFIBRATE PO, Take by mouth., Disp: , Rfl:    Omega-3 Fatty Acids (FISH OIL) 1000 MG CAPS, Take 1 capsule by mouth daily., Disp: , Rfl:    RAMIPRIL PO, Take 10 mg by mouth daily., Disp: , Rfl:    rosuvastatin (CRESTOR) 20 MG tablet, Take 1 tablet (20 mg total) by mouth daily., Disp: 90 tablet, Rfl: 3   Semaglutide (OZEMPIC, 1 MG/DOSE, [place]), Inject 1 mg into the skin once a week., Disp: , Rfl:    sildenafil (REVATIO) 20 MG tablet, TAKE 2 TO 5 TABLETS BY MOUTH AS A ONE TIME DOSE 30 MINUTES PRIOR TO NEED, Disp: , Rfl:    No Known Allergies  Past Medical History:  Diagnosis Date   High cholesterol    Hypertension    Kidney stone      Past Surgical History:  Procedure Laterality Date   LITHOTRIPSY      Family History  Problem Relation Age of Onset   Heart disease Mother        CABG   COPD Mother    Heart disease Father        CABG   Heart attack Father    Stroke Father    Cancer Father    Heart disease Brother 84       CABG   High Cholesterol Brother     Social History   Tobacco Use   Smoking status: Never   Smokeless tobacco: Never  Vaping Use   Vaping Use: Never used  Substance Use  Topics   Alcohol use: Yes    Comment: Rarely   Drug use: No    ROS   Objective:   Vitals: BP (!) 143/76 (BP Location: Right Arm)   Pulse 77   Temp 98.1 F (36.7 C) (Oral)   Resp 16   SpO2 98%   Physical Exam Constitutional:      General: He is not in acute distress.    Appearance: Normal appearance. He is well-developed and normal weight. He is not ill-appearing, toxic-appearing or diaphoretic.  HENT:     Head: Normocephalic and atraumatic.     Right Ear: External ear normal.     Left Ear: External ear normal.     Nose: Nose normal.     Mouth/Throat:     Pharynx: No pharyngeal swelling, oropharyngeal exudate, posterior oropharyngeal erythema or uvula swelling.     Tonsils: No tonsillar exudate or tonsillar abscesses. 0 on the right. 0 on the left.  Eyes:     General: No scleral icterus.       Right eye: No discharge.        Left eye: No discharge.     Extraocular Movements: Extraocular movements  intact.  Cardiovascular:     Rate and Rhythm: Normal rate.  Pulmonary:     Effort: Pulmonary effort is normal.  Musculoskeletal:     Cervical back: Normal range of motion.  Neurological:     Mental Status: He is alert and oriented to person, place, and time.  Psychiatric:        Mood and Affect: Mood normal.        Behavior: Behavior normal.        Thought Content: Thought content normal.        Judgment: Judgment normal.     Results for orders placed or performed during the hospital encounter of 04/01/23 (from the past 24 hour(s))  POCT rapid strep A     Status: Abnormal   Collection Time: 04/01/23  9:32 AM  Result Value Ref Range   Rapid Strep A Screen Positive (A) Negative    Assessment and Plan :   PDMP not reviewed this encounter.  1. Strep pharyngitis    Oral cytology pending. Will treat for strep pharyngitis.  Patient is to start amoxicillin, use supportive care otherwise. Counseled patient on potential for adverse effects with medications  prescribed/recommended today, ER and return-to-clinic precautions discussed, patient verbalized understanding.    Wallis Bamberg, PA-C 04/01/23 0935  At discharge patient requested evaluation for his right ear.  Has felt fullness, decreased hearing.  No drainage, tenderness.  On exam, patient has impacted cerumen of the right ear canal.  The external canal is well-appearing.  Ear lavage performed using mixture of peroxide and water.  Pressure irrigation performed using a bottle and a thin ear tube.  Right ear lavage.  No curette was used.  TM visualized and is without erythema, bulging.  Anticipatory guidance provided.   Wallis Bamberg, [place] 04/01/23 [phone]

## 2023-04-07 LAB — CYTOLOGY, (ORAL, ANAL, URETHRAL) ANCILLARY ONLY
Chlamydia: NEGATIVE
Comment: NEGATIVE
Comment: NEGATIVE
Comment: NORMAL
Neisseria Gonorrhea: NEGATIVE
Trichomonas: NEGATIVE

## 2023-07-12 ENCOUNTER — Other Ambulatory Visit: Payer: Self-pay | Admitting: Family Medicine

## 2023-07-12 DIAGNOSIS — L729 Follicular cyst of the skin and subcutaneous tissue, unspecified: Secondary | ICD-10-CM

## 2023-08-06 ENCOUNTER — Ambulatory Visit
Admission: RE | Admit: 2023-08-06 | Discharge: 2023-08-06 | Disposition: A | Discharge status: Home / Self Care | Payer: 59 | Source: Ambulatory Visit | Attending: Family Medicine | Admitting: Family Medicine

## 2023-08-06 DIAGNOSIS — L729 Follicular cyst of the skin and subcutaneous tissue, unspecified: Secondary | ICD-10-CM
